# Patient Record
Sex: Male | Born: 1990 | Race: Black or African American | Hispanic: No | Marital: Single | Smoking: Never smoker
Health system: Southern US, Community
[De-identification: ages and names within clinical notes are randomized; demographics above are authoritative.]

---
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
## 2011-11-15 IMAGING — CR DG SHOULDER 2+V*R*
4 series · 4 of 4 positions shown · non-contrast
Comparison: None

CLINICAL DATA: Pain.  Trauma.

RIGHT SHOULDER - 2+ VIEW

[view not recorded (1 of 4)]
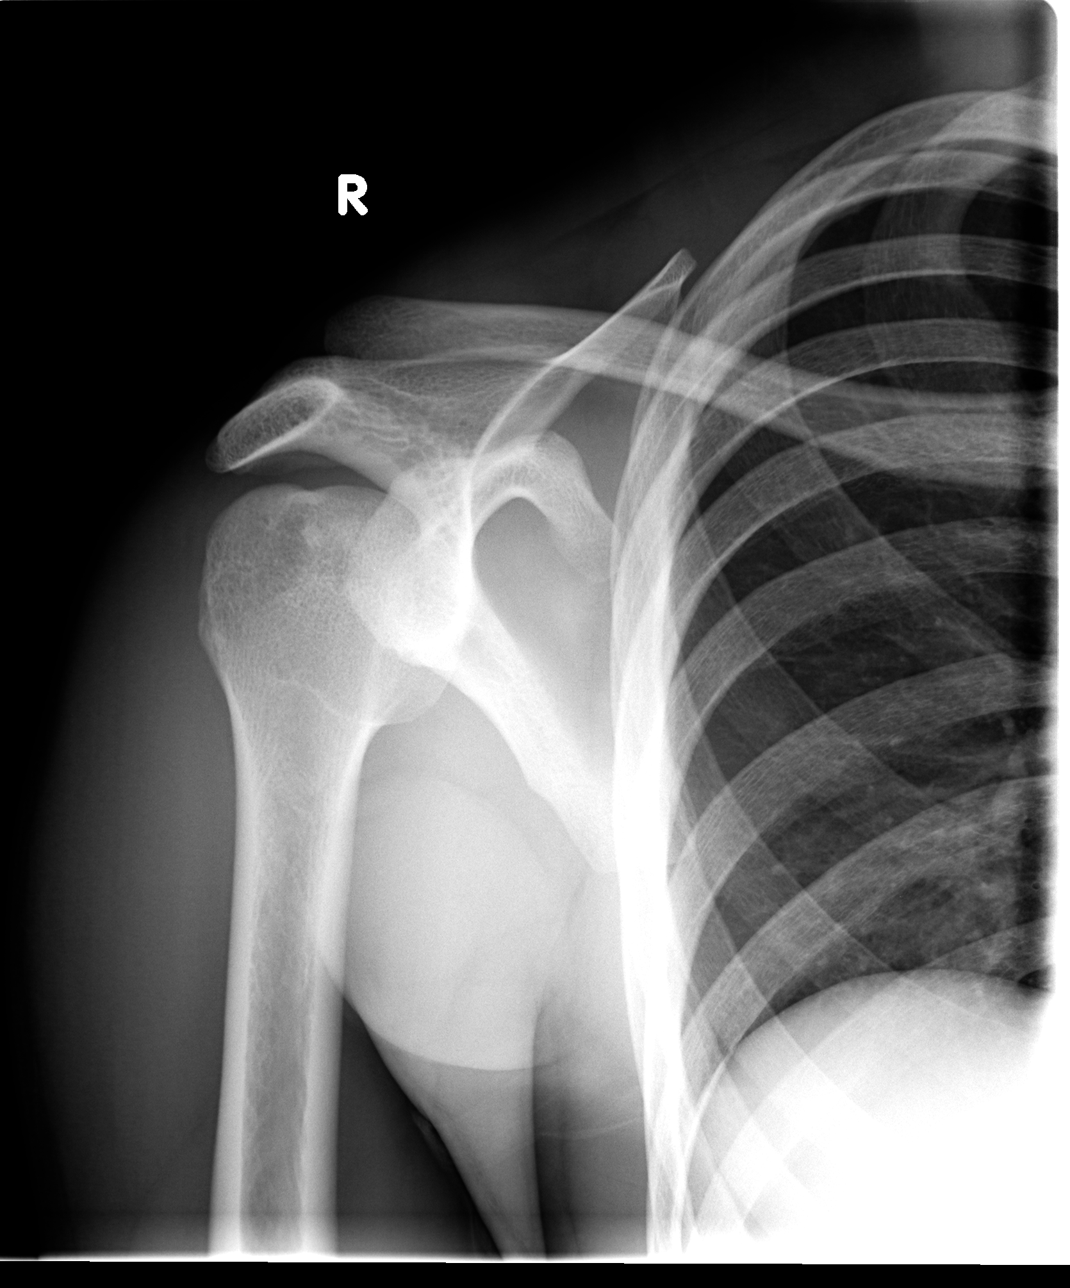

[view not recorded (2 of 4)]
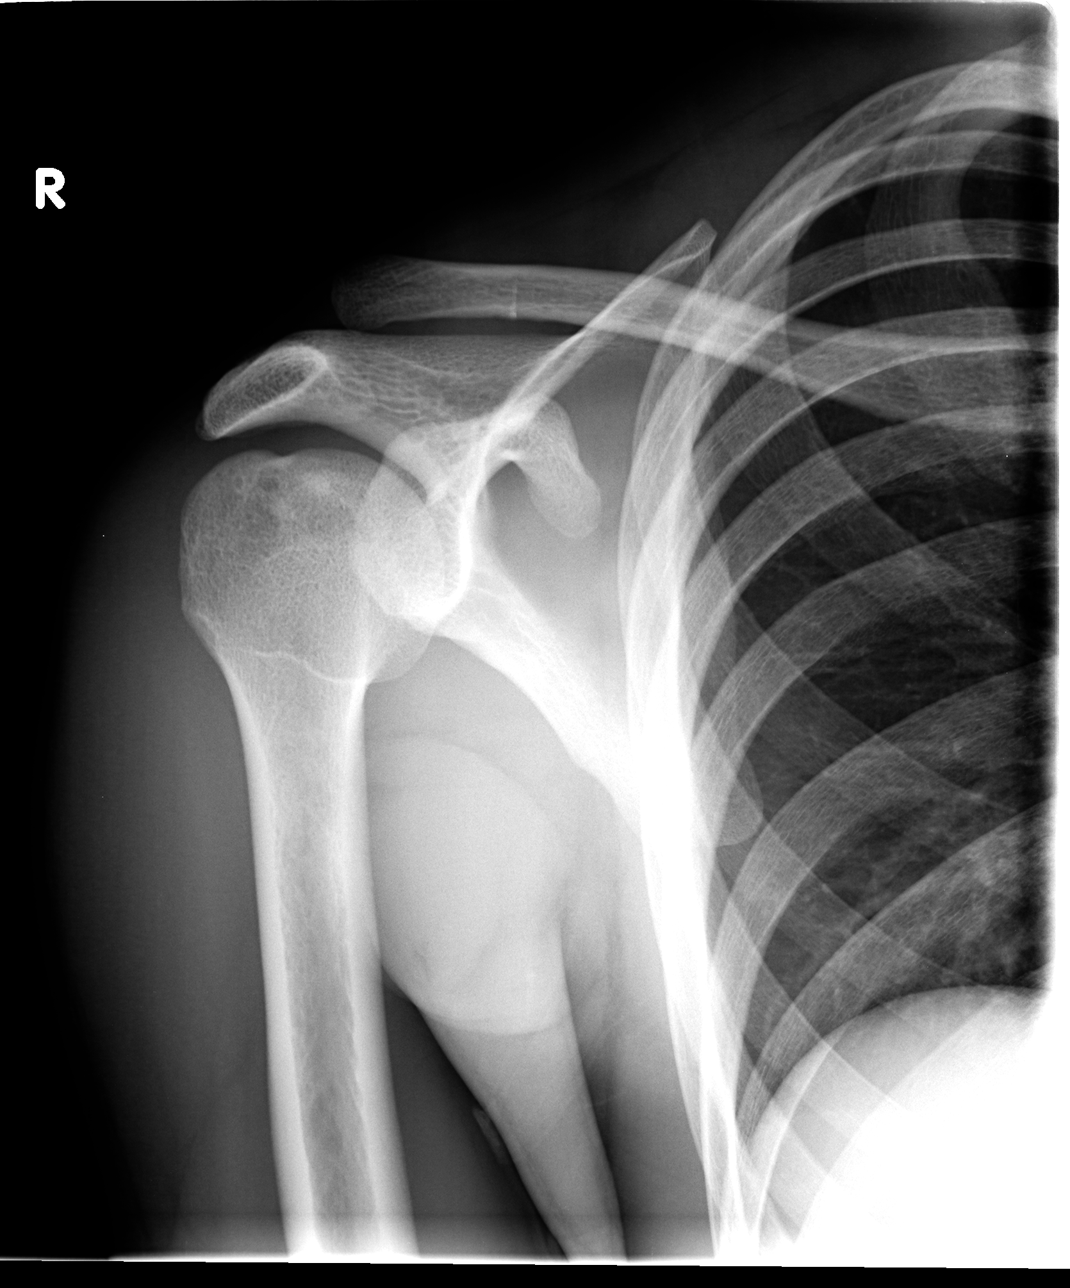

[view not recorded (3 of 4)]
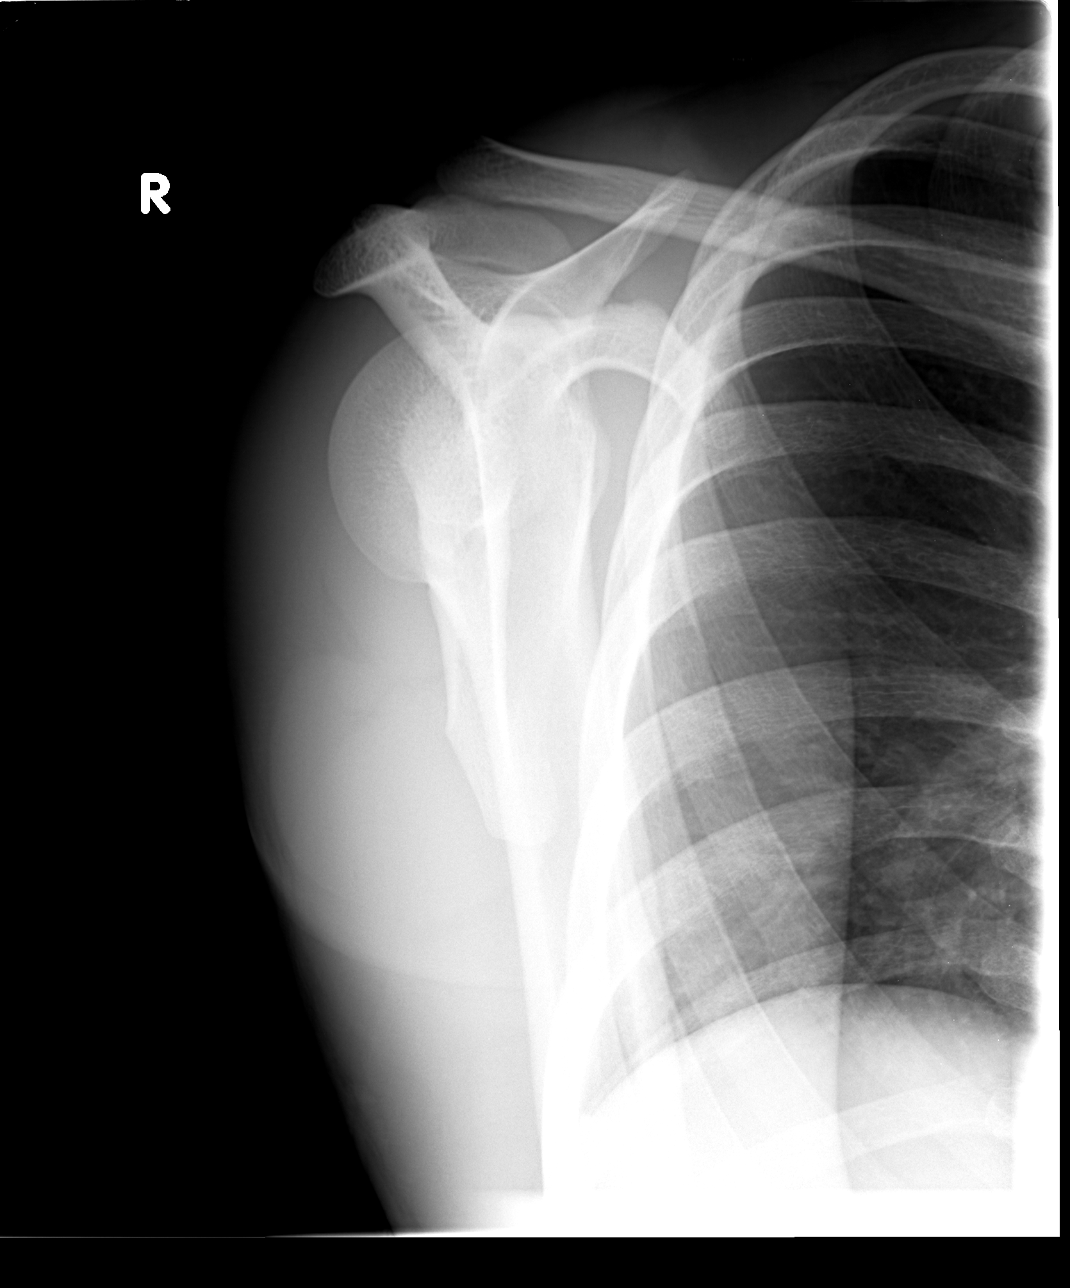

[view not recorded (4 of 4)]
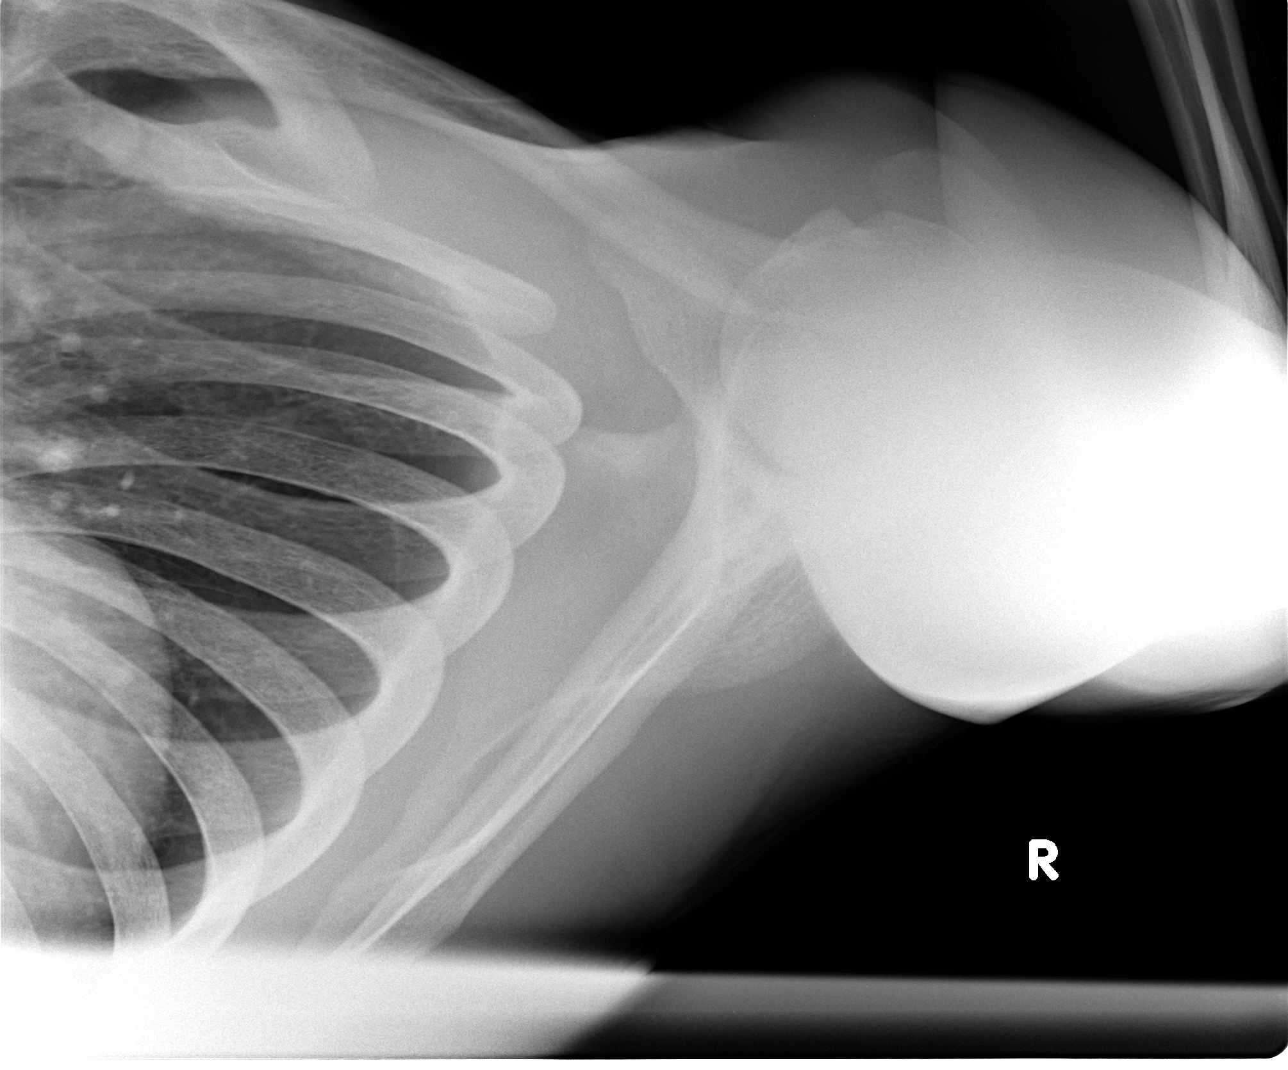

[4 of 4 positions shown; findings below may reference images not displayed]

FINDINGS: The glenohumeral joint is located.

No fracture identified.

Mild widening of the acromioclavicular interval which measures
mm.
IMPRESSION: 1.  Mild widening of the AC joint interval.  Cannot rule out to
grade II or grade III AC joint separation.

## 2012-04-15 ENCOUNTER — Encounter (HOSPITAL_COMMUNITY): Payer: Self-pay

## 2012-04-15 ENCOUNTER — Emergency Department (INDEPENDENT_AMBULATORY_CARE_PROVIDER_SITE_OTHER)
Admission: EM | Admit: 2012-04-15 | Discharge: 2012-04-15 | Disposition: A | Discharge status: Home / Self Care | Payer: Self-pay | Source: Home / Self Care

## 2012-04-15 DIAGNOSIS — S139XXA Sprain of joints and ligaments of unspecified parts of neck, initial encounter: Secondary | ICD-10-CM

## 2012-04-15 DIAGNOSIS — S43499A Other sprain of unspecified shoulder joint, initial encounter: Secondary | ICD-10-CM

## 2012-04-15 DIAGNOSIS — S46819A Strain of other muscles, fascia and tendons at shoulder and upper arm level, unspecified arm, initial encounter: Secondary | ICD-10-CM

## 2012-04-15 MED ORDER — TRAMADOL HCL 50 MG PO TABS: 50.0000 mg | ORAL_TABLET | Freq: Four times a day (QID) | ORAL | Status: AC | PRN

## 2012-04-15 MED ORDER — NAPROXEN 375 MG PO TABS: 375.0000 mg | ORAL_TABLET | Freq: Two times a day (BID) | ORAL | Status: AC

## 2012-04-15 MED ORDER — CYCLOBENZAPRINE HCL 5 MG PO TABS: 5.0000 mg | ORAL_TABLET | Freq: Three times a day (TID) | ORAL | Status: AC | PRN
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
## 2012-04-16 NOTE — ED Provider Notes (Signed)
Medical screening examination/treatment/procedure(s) were performed by resident physician or non-physician practitioner and as supervising physician I was immediately available for consultation/collaboration.   Barkley Bruns MD.   Linna Hoff, MD 04/16/12 2002

## 2014-08-04 ENCOUNTER — Encounter (HOSPITAL_COMMUNITY): Payer: Self-pay | Admitting: Emergency Medicine

## 2014-08-04 ENCOUNTER — Other Ambulatory Visit (HOSPITAL_COMMUNITY)
Admission: RE | Admit: 2014-08-04 | Discharge: 2014-08-04 | Disposition: A | Discharge status: Home / Self Care | Payer: Self-pay | Source: Ambulatory Visit | Attending: Emergency Medicine | Admitting: Emergency Medicine

## 2014-08-04 ENCOUNTER — Emergency Department (INDEPENDENT_AMBULATORY_CARE_PROVIDER_SITE_OTHER)
Admission: EM | Admit: 2014-08-04 | Discharge: 2014-08-04 | Disposition: A | Discharge status: Home / Self Care | Payer: Self-pay | Source: Home / Self Care | Attending: Emergency Medicine | Admitting: Emergency Medicine

## 2014-08-04 DIAGNOSIS — R300 Dysuria: Secondary | ICD-10-CM

## 2014-08-04 DIAGNOSIS — Z113 Encounter for screening for infections with a predominantly sexual mode of transmission: Secondary | ICD-10-CM | POA: Insufficient documentation

## 2014-08-04 LAB — POCT URINALYSIS DIP (DEVICE)
Glucose, UA: NEGATIVE mg/dL
Hgb urine dipstick: NEGATIVE
Ketones, ur: NEGATIVE mg/dL
Leukocytes, UA: NEGATIVE
Nitrite: NEGATIVE
PH: 6 (ref 5.0–8.0)
Protein, ur: 30 mg/dL — AB
Specific Gravity, Urine: >=1.03 (ref 1.005–1.030)
Urobilinogen, UA: 1 mg/dL (ref 0.0–1.0)

## 2014-08-04 MED ORDER — AZITHROMYCIN 250 MG PO TABS
ORAL_TABLET | ORAL | Status: AC
  Filled 2014-08-04: qty 4

## 2014-08-04 MED ORDER — LIDOCAINE HCL (PF) 1 % IJ SOLN
INTRAMUSCULAR | Status: AC
  Filled 2014-08-04: qty 5

## 2014-08-04 MED ORDER — CEFTRIAXONE SODIUM 250 MG IJ SOLR: 250.0000 mg | Freq: Once | INTRAMUSCULAR | Status: DC

## 2014-08-04 MED ORDER — CEFTRIAXONE SODIUM 250 MG IJ SOLR
INTRAMUSCULAR | Status: AC
  Filled 2014-08-04: qty 250

## 2014-08-04 MED ORDER — AZITHROMYCIN 250 MG PO TABS: 1000.0000 mg | ORAL_TABLET | Freq: Once | ORAL | Status: DC

## 2014-08-04 NOTE — ED Notes (Signed)
Patient provided only one urine specimen, no clean catch urine specimen

## 2014-08-04 NOTE — ED Provider Notes (Addendum)
CSN: [id number]     Arrival date & time 08/04/14  1259 History   First MD Initiated Contact with Patient 08/04/14 1325     Chief Complaint  Patient presents with  . Dysuria   (Consider location/radiation/quality/duration/timing/severity/associated sxs/prior Treatment) HPI  He is a 24 year old man here for evaluation of dysuria. He states this started about 2 weeks ago and occurs every time he urinates. He denies any urinary urgency, hesitancy, frequency, foul odor. No penile discharge or rashes. No abdominal pain or back pain. No fevers or chills. He denies any new partners, but states he is sexually active. He does not always use a condom. He also states that 6-8 weeks ago he started having difficulty getting and maintaining an erection. He reports decreased morning erections and decreased masturbation as well. He does report some increased stress in his life, but does not associate that with these symptoms.  History reviewed. No pertinent past medical history. History reviewed. No pertinent past surgical history. No family history on file. History  Substance Use Topics  . Smoking status: Never Smoker   . Smokeless tobacco: Not on file  . Alcohol Use: No    Review of Systems As in history of present illness Allergies  Review of patient's allergies indicates no known allergies.  Home Medications   Prior to Admission medications   Medication Sig Start Date End Date Taking? Authorizing Provider  cyclobenzaprine (FLEXERIL) 5 MG tablet Take 1 tablet (5 mg total) by mouth 3 (three) times daily as needed for muscle spasms. 04/15/12   Hayden Rasmussenavid Mabe, NP  naproxen (NAPROSYN) 375 MG tablet Take 1 tablet (375 mg total) by mouth 2 (two) times daily. 04/15/12   Hayden Rasmussenavid Mabe, NP  traMADol (ULTRAM) 50 MG tablet Take 1 tablet (50 mg total) by mouth every 6 (six) hours as needed for pain. 04/15/12   Hayden Rasmussenavid Mabe, NP   BP 136/71 mmHg  Pulse 71  Temp(Src) 97.8 F (36.6 C) (Oral)  Resp 16  SpO2 99% Physical  Exam  Constitutional: He is oriented to person, place, and time. He appears well-developed and well-nourished. No distress.  Cardiovascular: Normal rate.   Pulmonary/Chest: Effort normal.  Abdominal: Soft. He exhibits no distension. There is no tenderness. There is no rebound and no guarding.  No CVA tenderness  Neurological: He is alert and oriented to person, place, and time.    ED Course  Procedures (including critical care time) Labs Review Labs Reviewed  POCT URINALYSIS DIP (DEVICE) - Abnormal; Notable for the following:    Bilirubin Urine SMALL (*)    Protein, ur 30 (*)    All other components within normal limits  HIV ANTIBODY (ROUTINE TESTING)  RPR  URINE CYTOLOGY ANCILLARY ONLY    Imaging Review No results found.   MDM   1. Dysuria    Treatment presumptively for gonorrhea and Chlamydia with Rocephin 250 mg IM and azithromycin 1 g by mouth.  Gonorrhea, chlamydia, trichomonas sent. HIV and RPR sent as well. Discussed that his erection difficulties will likely resolve on their own given his age and lack of medical problems. If these persist, I recommended follow-up with urology.    Charm RingsErin J Kailan Carmen, MD 08/04/14 1358  Charm RingsErin J Aarica Wax, MD 08/04/14 1400

## 2014-08-04 NOTE — Discharge Instructions (Signed)
We treated you today for gonorrhea and chlamydia. We sent STD testing. We will call you if anything is positive. No sex for one week. Please wear a condom. If you continue to have difficulty with erections, please see the urologist.

## 2014-08-04 NOTE — ED Notes (Signed)
C/o mild dysuria onset 2 weeks Denies penile d/c, fevers, chills, hematuria, abd pain Alert, no signs of acute distress.

## 2014-08-05 LAB — URINE CYTOLOGY ANCILLARY ONLY
Chlamydia: POSITIVE — AB
Neisseria Gonorrhea: NEGATIVE
Trichomonas: NEGATIVE

## 2014-08-05 LAB — RPR: RPR Ser Ql: NONREACTIVE

## 2014-08-05 LAB — HIV ANTIBODY (ROUTINE TESTING W REFLEX): HIV SCREEN 4TH GENERATION: NONREACTIVE

## 2014-08-05 NOTE — ED Notes (Signed)
Final report of STD screening positive for chlamydia. treatment adequate on day of visit. Form 2124 completed, [facility]. Call to patient on number provided on day of visit. Left message for patient to contact us to discuss visit

## 2014-08-05 NOTE — ED Notes (Addendum)
Patient returned call, and after verifying ID, discussed positive findings. Chlamydia positive, GC negative  Was advised no sex x 1 week, and he is to inform any partners of their exposure, so they may also be treated. Patient verbalised understanding. HIV and RPR reports negative

## 2014-09-10 ENCOUNTER — Emergency Department (HOSPITAL_COMMUNITY)
Admission: EM | Admit: 2014-09-10 | Discharge: 2014-09-10 | Disposition: A | Discharge status: Home / Self Care | Payer: Self-pay

## 2014-09-12 ENCOUNTER — Encounter (HOSPITAL_COMMUNITY): Payer: Self-pay | Admitting: Emergency Medicine

## 2014-09-12 ENCOUNTER — Emergency Department (INDEPENDENT_AMBULATORY_CARE_PROVIDER_SITE_OTHER)
Admission: EM | Admit: 2014-09-12 | Discharge: 2014-09-12 | Disposition: A | Discharge status: Home / Self Care | Payer: Self-pay | Source: Home / Self Care | Attending: Family Medicine | Admitting: Family Medicine

## 2014-09-12 DIAGNOSIS — Z711 Person with feared health complaint in whom no diagnosis is made: Secondary | ICD-10-CM

## 2014-09-12 MED ORDER — DOXYCYCLINE HYCLATE 100 MG PO CAPS: 100.0000 mg | ORAL_CAPSULE | Freq: Two times a day (BID) | ORAL | Status: AC

## 2014-09-12 NOTE — ED Notes (Addendum)
Pt returns today with continued STD sx's post treatment of Chlamydia x 1 month ago,denies penile d/c C/o burning on urination, denies pain, chills or fever States he had unprotected sexual intercourse 1 week post treatment

## 2014-09-12 NOTE — ED Provider Notes (Signed)
CSN: [id number]     Arrival date & time 09/12/14  1304 History   First MD Initiated Contact with Patient 09/12/14 1313     Chief Complaint  Patient presents with  . SEXUALLY TRANSMITTED DISEASE   (Consider location/radiation/quality/duration/timing/severity/associated sxs/prior Treatment) Patient is a 24 y.o. male presenting with STD exposure. The history is provided by the patient.  Exposure to STD This is a recurrent problem. The current episode started more than 1 week ago (treated for chlamydia 1 mo ago, sx never resolved, continues with unprotected sex.). The problem has not changed since onset.Pertinent negatives include no chest pain and no abdominal pain.    History reviewed. No pertinent past medical history. History reviewed. No pertinent past surgical history. No family history on file. Social History  Substance Use Topics  . Smoking status: Never Smoker   . Smokeless tobacco: None  . Alcohol Use: No    Review of Systems  Constitutional: Negative.   Cardiovascular: Negative for chest pain.  Gastrointestinal: Negative for abdominal pain.  Genitourinary: Positive for dysuria. Negative for discharge, penile swelling, scrotal swelling, penile pain and testicular pain.    Allergies  Review of patient's allergies indicates no known allergies.  Home Medications   Prior to Admission medications   Medication Sig Start Date End Date Taking? Authorizing Provider  cyclobenzaprine (FLEXERIL) 5 MG tablet Take 1 tablet (5 mg total) by mouth 3 (three) times daily as needed for muscle spasms. 04/15/12   Hayden Rasmussen, NP  doxycycline (VIBRAMYCIN) 100 MG capsule Take 1 capsule (100 mg total) by mouth 2 (two) times daily. 09/12/14   Linna Hoff, MD  naproxen (NAPROSYN) 375 MG tablet Take 1 tablet (375 mg total) by mouth 2 (two) times daily. 04/15/12   Hayden Rasmussen, NP  traMADol (ULTRAM) 50 MG tablet Take 1 tablet (50 mg total) by mouth every 6 (six) hours as needed for pain. 04/15/12   Hayden Rasmussen, NP   BP 139/65 mmHg  Pulse 70  Temp(Src) 98.4 F (36.9 C) (Oral)  Resp 16  SpO2 98% Physical Exam  Constitutional: He is oriented to person, place, and time. He appears well-developed and well-nourished. No distress.  Abdominal: Soft. Bowel sounds are normal.  Genitourinary: Penis normal.  Neurological: He is alert and oriented to person, place, and time.  Skin: Skin is warm and dry.  Nursing note and vitals reviewed.   ED Course  Procedures (including critical care time) Labs Review Labs Reviewed - No data to display  Imaging Review No results found.   MDM   1. Concern about STD in male without diagnosis        Linna Hoff, MD 09/12/14 1327

## 2015-09-20 ENCOUNTER — Encounter (HOSPITAL_COMMUNITY): Payer: Self-pay | Admitting: *Deleted

## 2015-09-20 ENCOUNTER — Emergency Department (HOSPITAL_COMMUNITY)
Admission: EM | Admit: 2015-09-20 | Discharge: 2015-09-20 | Disposition: A | Discharge status: Home / Self Care | Payer: Self-pay | Attending: Emergency Medicine | Admitting: Emergency Medicine

## 2015-09-20 DIAGNOSIS — R300 Dysuria: Secondary | ICD-10-CM | POA: Insufficient documentation

## 2015-09-20 DIAGNOSIS — Z79899 Other long term (current) drug therapy: Secondary | ICD-10-CM | POA: Insufficient documentation

## 2015-09-20 DIAGNOSIS — R590 Localized enlarged lymph nodes: Secondary | ICD-10-CM | POA: Insufficient documentation

## 2015-09-20 LAB — URINALYSIS, ROUTINE W REFLEX MICROSCOPIC
Glucose, UA: NEGATIVE mg/dL
Hgb urine dipstick: NEGATIVE
KETONES UR: 15 mg/dL — AB
Leukocytes, UA: NEGATIVE
Nitrite: NEGATIVE
Protein, ur: NEGATIVE mg/dL
SPECIFIC GRAVITY, URINE: 1.038 — AB (ref 1.005–1.030)
pH: 5.5 (ref 5.0–8.0)

## 2015-09-20 MED ORDER — IBUPROFEN 800 MG PO TABS: 800.0000 mg | ORAL_TABLET | Freq: Three times a day (TID) | ORAL | 0 refills | Status: AC | PRN

## 2015-09-20 MED ORDER — ONDANSETRON 8 MG PO TBDP
8.0000 mg | ORAL_TABLET | Freq: Once | ORAL | Status: AC
  Administered 2015-09-20: 8 mg via ORAL
  Filled 2015-09-20: qty 1

## 2015-09-20 MED ORDER — METRONIDAZOLE 500 MG PO TABS
2000.0000 mg | ORAL_TABLET | Freq: Once | ORAL | Status: AC
  Administered 2015-09-20: 2000 mg via ORAL
  Filled 2015-09-20: qty 4

## 2015-09-20 MED ORDER — AZITHROMYCIN 1 G PO PACK
1.0000 g | PACK | Freq: Once | ORAL | Status: AC
  Administered 2015-09-20: 1 g via ORAL
  Filled 2015-09-20 (×2): qty 1

## 2015-09-20 MED ORDER — PHENAZOPYRIDINE HCL 200 MG PO TABS: 200.0000 mg | ORAL_TABLET | Freq: Three times a day (TID) | ORAL | 0 refills | Status: AC | PRN

## 2015-09-20 MED ORDER — LIDOCAINE HCL 1 % IJ SOLN
INTRAMUSCULAR | Status: AC
  Administered 2015-09-20: 20 mL
  Filled 2015-09-20: qty 20

## 2015-09-20 MED ORDER — CEFTRIAXONE SODIUM 250 MG IJ SOLR
250.0000 mg | Freq: Once | INTRAMUSCULAR | Status: AC
  Administered 2015-09-20: 250 mg via INTRAMUSCULAR
  Filled 2015-09-20: qty 250

## 2015-09-20 NOTE — Discharge Instructions (Signed)
Read the information below.  Use the prescribed medication as directed.  Please discuss all new medications with your pharmacist.  You may return to the Emergency Department at any time for worsening condition or any new symptoms that concern you.  If you develop high fevers, abdominal pain, uncontrolled vomiting, or are unable to tolerate fluids by mouth, return to the ER for a recheck.   °

## 2015-09-20 NOTE — ED Provider Notes (Signed)
[facility]-EMERGENCY DEPT Provider Note   CSN: [id number] Arrival date & time: 09/20/15  1646  By signing my name below, I, Dan Cooper, attest that this documentation has been prepared under the direction and in the presence of [facility] Campus[facility]ily Aiza Vollrath, PA-C. Electronically Signed: Javier Dockerobert Ryan Cooper, ER Scribe. 09/10/2015. 8:05 PM.   History   Chief Complaint Chief Complaint  Patient presents with  . Pelvic Pain    HPI  HPI Comments: Dan MattocksMalcolm Cooper is a 25 y.o. male who presents to the Emergency Department complaining of discomfort with urination, penile pain, testicular pain, and swollen lumps in his groin for the last two weeks. His sx started with dysuria two weeks ago. That sx resolved after he drank apple cider vinegar.  The pain in his penis and testicles is sharp, intermittent, lasts less than a second at a time.  He has had the same sexual partner for a year and they use protection some of the time.  Pt also notes he has had some problems with getting erections for some time, but worse now that he has this painful lymph node.  He denies fever, chills, nausea, or vomiting.  Denies any testicular or scrotal swelling.   Denies any skin lesions in his groin or legs.  Never had any back, flank, or abdominal pain.    History reviewed. No pertinent past medical history.  There are no active problems to display for this patient.   History reviewed. No pertinent surgical history.   Home Medications    Prior to Admission medications   Medication Sig Start Date End Date Taking? Authorizing Provider  cyclobenzaprine (FLEXERIL) 5 MG tablet Take 1 tablet (5 mg total) by mouth 3 (three) times daily as needed for muscle spasms. 04/15/12   Hayden Rasmussenavid Mabe, NP  doxycycline (VIBRAMYCIN) 100 MG capsule Take 1 capsule (100 mg total) by mouth 2 (two) times daily. 09/12/14   Linna HoffJames D Kindl, MD  ibuprofen (ADVIL,MOTRIN) 800 MG tablet Take 1 tablet (800 mg total) by mouth every 8 (eight) hours as needed for mild pain  or moderate pain. 09/20/15   Trixie DredgeEmily Mehki Klumpp, PA-C  naproxen (NAPROSYN) 375 MG tablet Take 1 tablet (375 mg total) by mouth 2 (two) times daily. 04/15/12   Hayden Rasmussenavid Mabe, NP  phenazopyridine (PYRIDIUM) 200 MG tablet Take 1 tablet (200 mg total) by mouth 3 (three) times daily as needed for pain (burning with urination). 09/20/15   Trixie DredgeEmily Navy Belay, PA-C  traMADol (ULTRAM) 50 MG tablet Take 1 tablet (50 mg total) by mouth every 6 (six) hours as needed for pain. 04/15/12   Hayden Rasmussenavid Mabe, NP    Family History No family history on file.  Social History Social History  Substance Use Topics  . Smoking status: Never Smoker  . Smokeless tobacco: Never Used  . Alcohol use No     Allergies   Review of patient's allergies indicates no known allergies.   Review of Systems Review of Systems  All other systems reviewed and are negative.    Physical Exam Updated Vital Signs BP 108/88 (BP Location: Left Arm)   Pulse 66   Temp 98.4 F (36.9 C) (Oral)   Resp 18   SpO2 100%   Physical Exam  Constitutional: He appears well-developed and well-nourished. No distress.  HENT:  Head: Normocephalic and atraumatic.  Neck: Neck supple.  Pulmonary/Chest: Effort normal.  Abdominal: Soft. He exhibits no distension and no mass. There is no tenderness. There is no rebound and no guarding.  Genitourinary: Testes normal  and penis normal. Right testis shows no mass, no swelling and no tenderness. Right testis is descended. Left testis shows no mass, no swelling and no tenderness. Left testis is descended. Circumcised. No penile erythema or penile tenderness. No discharge found.  Musculoskeletal:  Spine nontender, no crepitus, or stepoffs.   Lymphadenopathy: Inguinal adenopathy noted on the right side. No inguinal adenopathy noted on the left side.  Neurological: He is alert.  Skin: He is not diaphoretic.  Nursing note and vitals reviewed.    ED Treatments / Results  DIAGNOSTIC STUDIES: Oxygen Saturation is 100% on RA,  normal by my interpretation.    COORDINATION OF CARE: 8:05 PM Discussed treatment plan with pt at bedside which includes STI screening and treatment if required and pt agreed to plan.  Labs (all labs ordered are listed, but only abnormal results are displayed) Labs Reviewed  URINALYSIS, ROUTINE W REFLEX MICROSCOPIC (NOT AT [facility]) - Abnormal; Notable for the following:       Result Value   Specific Gravity, Urine 1.038 (*)    Bilirubin Urine SMALL (*)    Ketones, ur 15 (*)    All other components within normal limits  URINE CULTURE  RPR  HIV ANTIBODY (ROUTINE TESTING)  GC/CHLAMYDIA PROBE AMP ([place]) NOT AT [facility]    EKG  EKG Interpretation None       Radiology No results found.  Procedures Procedures (including critical care time)  Medications Ordered in ED Medications  cefTRIAXone (ROCEPHIN) injection 250 mg (not administered)  azithromycin (ZITHROMAX) powder 1 g (not administered)  ondansetron (ZOFRAN-ODT) disintegrating tablet 8 mg (not administered)  metroNIDAZOLE (FLAGYL) tablet 2,000 mg (not administered)  lidocaine (XYLOCAINE) 1 % (with pres) injection (not administered)     Initial Impression / Assessment and Plan / ED Course  I have reviewed the triage vital signs and the nursing notes.  Pertinent labs & imaging results that were available during my care of the patient were reviewed by me and considered in my medical decision making (see chart for details).  Clinical Course   Afebrile, nontoxic patient with right inguinal lymphadenopathy (single lymph node) with resolved dysuria and intermittent sharp shooting pains in the penis and testicles.  He never had flank, back, or abdominal pain.  Doubt ureteral stone.  Pt doubts STD but full testing and treatment given (by his request).  UA does not appear infected - does appear somewhat dehydrated but pt very thirsty, worked all day in [employer].  Urine sent for culture.  Given symptoms and patient's concerns,  also patient's concerns with erectile dysfunction, will refer to urology.   D/C home with urology follow up.  Pt advised HIV/STD tests, urine culture pending.    Discussed result, findings, treatment, and follow up  with patient.  Pt given return precautions.  Pt verbalizes understanding and agrees with plan.       Final Clinical Impressions(s) / ED Diagnoses   Final diagnoses:  Inguinal lymphadenopathy  Dysuria    New Prescriptions New Prescriptions   IBUPROFEN (ADVIL,MOTRIN) 800 MG TABLET    Take 1 tablet (800 mg total) by mouth every 8 (eight) hours as needed for mild pain or moderate pain.   PHENAZOPYRIDINE (PYRIDIUM) 200 MG TABLET    Take 1 tablet (200 mg total) by mouth 3 (three) times daily as needed for pain (burning with urination).     I personally performed the services described in this documentation, which was scribed in my presence. The recorded information has been reviewed  and is accurate.     Trixie Dredge, PA-C 09/20/15 2132    Lorre Nick, MD 09/21/15 [phone]

## 2015-09-20 NOTE — ED Triage Notes (Signed)
Pt complains of painful lump to his pelvic area for the past week. Pt states he also had burning urination, which he states has improved after drinking more fluids.

## 2015-09-21 LAB — RPR: RPR Ser Ql: NONREACTIVE

## 2015-09-21 LAB — GC/CHLAMYDIA PROBE AMP (~~LOC~~) NOT AT ARMC
Chlamydia: NEGATIVE
Neisseria Gonorrhea: NEGATIVE

## 2015-09-21 LAB — HIV ANTIBODY (ROUTINE TESTING W REFLEX): HIV Screen 4th Generation wRfx: NONREACTIVE

## 2015-09-26 ENCOUNTER — Telehealth (HOSPITAL_BASED_OUTPATIENT_CLINIC_OR_DEPARTMENT_OTHER): Payer: Self-pay | Admitting: Emergency Medicine

## 2018-01-13 ENCOUNTER — Emergency Department (HOSPITAL_COMMUNITY)
Admission: EM | Admit: 2018-01-13 | Discharge: 2018-01-13 | Disposition: A | Discharge status: Home / Self Care | Payer: Self-pay | Attending: Emergency Medicine | Admitting: Emergency Medicine

## 2018-01-13 ENCOUNTER — Encounter (HOSPITAL_COMMUNITY): Payer: Self-pay

## 2018-01-13 ENCOUNTER — Emergency Department (HOSPITAL_COMMUNITY): Payer: Self-pay

## 2018-01-13 DIAGNOSIS — J069 Acute upper respiratory infection, unspecified: Secondary | ICD-10-CM | POA: Insufficient documentation

## 2018-01-13 DIAGNOSIS — Z7982 Long term (current) use of aspirin: Secondary | ICD-10-CM | POA: Insufficient documentation

## 2018-01-13 LAB — COMPREHENSIVE METABOLIC PANEL
ALK PHOS: 43 U/L (ref 38–126)
ALT: 30 U/L (ref 0–44)
AST: 28 U/L (ref 15–41)
Albumin: 3.9 g/dL (ref 3.5–5.0)
Anion gap: 7 (ref 5–15)
BUN: 6 mg/dL (ref 6–20)
CO2: 24 mmol/L (ref 22–32)
Calcium: 8.9 mg/dL (ref 8.9–10.3)
Chloride: 111 mmol/L (ref 98–111)
Creatinine, Ser: 0.98 mg/dL (ref 0.61–1.24)
GFR calc Af Amer: >60 mL/min (ref >60)
GFR calc non Af Amer: >60 mL/min (ref >60)
Glucose, Bld: 107 mg/dL — ABNORMAL HIGH (ref 70–99)
Potassium: 4 mmol/L (ref 3.5–5.1)
SODIUM: 142 mmol/L (ref 135–145)
Total Bilirubin: 0.7 mg/dL (ref 0.3–1.2)
Total Protein: 6.3 g/dL — ABNORMAL LOW (ref 6.5–8.1)

## 2018-01-13 LAB — CBC WITH DIFFERENTIAL/PLATELET
Abs Immature Granulocytes: 0.03 10*3/uL (ref 0.00–0.07)
Basophils Absolute: 0 10*3/uL (ref 0.0–0.1)
Basophils Relative: 0 %
EOS ABS: 0.1 10*3/uL (ref 0.0–0.5)
Eosinophils Relative: 1 %
HCT: 44.6 % (ref 39.0–52.0)
Hemoglobin: 14.9 g/dL (ref 13.0–17.0)
IMMATURE GRANULOCYTES: 0 %
Lymphocytes Relative: 8 %
Lymphs Abs: 1 10*3/uL (ref 0.7–4.0)
MCH: 29.4 pg (ref 26.0–34.0)
MCHC: 33.4 g/dL (ref 30.0–36.0)
MCV: 88 fL (ref 80.0–100.0)
Monocytes Absolute: 0.7 10*3/uL (ref 0.1–1.0)
Monocytes Relative: 6 %
Neutro Abs: 10.5 10*3/uL — ABNORMAL HIGH (ref 1.7–7.7)
Neutrophils Relative %: 85 %
Platelets: 159 10*3/uL (ref 150–400)
RBC: 5.07 MIL/uL (ref 4.22–5.81)
RDW: 12.6 % (ref 11.5–15.5)
WBC: 12.4 10*3/uL — ABNORMAL HIGH (ref 4.0–10.5)
nRBC: 0 % (ref 0.0–0.2)

## 2018-01-13 LAB — I-STAT CG4 LACTIC ACID, ED
Lactic Acid, Venous: 1.77 mmol/L (ref 0.5–1.9)
Lactic Acid, Venous: 0.94 mmol/L (ref 0.5–1.9)

## 2018-01-13 LAB — INFLUENZA PANEL BY PCR (TYPE A & B)
Influenza A By PCR: NEGATIVE
Influenza B By PCR: NEGATIVE

## 2018-01-13 MED ORDER — ALBUTEROL SULFATE (2.5 MG/3ML) 0.083% IN NEBU
5.0000 mg | INHALATION_SOLUTION | Freq: Once | RESPIRATORY_TRACT | Status: AC
  Administered 2018-01-13: 5 mg via RESPIRATORY_TRACT
  Filled 2018-01-13: qty 6

## 2018-01-13 MED ORDER — SODIUM CHLORIDE 0.9 % IV BOLUS
1000.0000 mL | Freq: Once | INTRAVENOUS | Status: AC
  Administered 2018-01-13: 1000 mL via INTRAVENOUS

## 2018-01-13 MED ORDER — ONDANSETRON HCL 4 MG/2ML IJ SOLN
4.0000 mg | Freq: Once | INTRAMUSCULAR | Status: AC
  Administered 2018-01-13: 4 mg via INTRAVENOUS
  Filled 2018-01-13: qty 2

## 2018-01-13 MED ORDER — ALBUTEROL SULFATE HFA 108 (90 BASE) MCG/ACT IN AERS
2.0000 | INHALATION_SPRAY | RESPIRATORY_TRACT | Status: DC | PRN
  Administered 2018-01-13: 2 via RESPIRATORY_TRACT
  Filled 2018-01-13: qty 6.7

## 2018-01-13 MED ORDER — KETOROLAC TROMETHAMINE 30 MG/ML IJ SOLN
30.0000 mg | Freq: Once | INTRAMUSCULAR | Status: AC
  Administered 2018-01-13: 30 mg via INTRAVENOUS
  Filled 2018-01-13: qty 1

## 2018-01-13 MED ORDER — GUAIFENESIN-DM 100-10 MG/5ML PO SYRP: 5.0000 mL | ORAL_SOLUTION | Freq: Three times a day (TID) | ORAL | 0 refills | Status: AC | PRN

## 2018-01-13 NOTE — ED Notes (Signed)
Patient transported to X-ray 

## 2018-01-13 NOTE — ED Notes (Signed)
Patient ambulated down hall on room air at 95%.

## 2018-01-13 NOTE — ED Provider Notes (Signed)
[facility]-EMERGENCY DEPT Provider Note   CSN: [id number] Arrival date & time: 01/13/18  1345     History   Chief Complaint Chief Complaint  Patient presents with  . Shortness of Breath    HPI Dan Cooper is a 27 y.o. male.  HPI Patient with shortness of breath and cough.  Woke up this morning.  Fevers.  Does not have clear sick contacts.  Reportedly had rhonchi for EMS but no rhonchi for me.  Had breathing treatment and states that he feels better.  Does not have lung problems.  No history of asthma.  Has had cough with some mild sputum production.  No nausea or vomiting.  No sore throat.  States his chest hurts when he coughs and breathes. History reviewed. No pertinent past medical history.  There are no active problems to display for this patient.   History reviewed. No pertinent surgical history.      Home Medications    Prior to Admission medications   Medication Sig Start Date End Date Taking? Authorizing Provider  aspirin 81 MG chewable tablet Chew 162 mg by mouth daily as needed for mild pain.   Yes [provider]  cyclobenzaprine (FLEXERIL) 5 MG tablet Take 1 tablet (5 mg total) by mouth 3 (three) times daily as needed for muscle spasms. Patient not taking: Reported on 01/13/2018 04/15/12   Hayden RasmussenMabe, David, NP  doxycycline (VIBRAMYCIN) 100 MG capsule Take 1 capsule (100 mg total) by mouth 2 (two) times daily. Patient not taking: Reported on 01/13/2018 09/12/14   Linna HoffKindl, James D, MD  guaiFENesin-dextromethorphan (ROBITUSSIN DM) 100-10 MG/5ML syrup Take 5 mLs by mouth 3 (three) times daily as needed for cough. 01/13/18   Benjiman CorePickering, Daniil Labarge, MD  ibuprofen (ADVIL,MOTRIN) 800 MG tablet Take 1 tablet (800 mg total) by mouth every 8 (eight) hours as needed for mild pain or moderate pain. Patient not taking: Reported on 01/13/2018 09/20/15   Trixie DredgeWest, Emily, PA-C  naproxen (NAPROSYN) 375 MG tablet Take 1 tablet (375 mg total) by mouth 2 (two) times  daily. Patient not taking: Reported on 01/13/2018 04/15/12   Hayden RasmussenMabe, David, NP  phenazopyridine (PYRIDIUM) 200 MG tablet Take 1 tablet (200 mg total) by mouth 3 (three) times daily as needed for pain (burning with urination). Patient not taking: Reported on 01/13/2018 09/20/15   Trixie DredgeWest, Emily, PA-C  traMADol (ULTRAM) 50 MG tablet Take 1 tablet (50 mg total) by mouth every 6 (six) hours as needed for pain. Patient not taking: Reported on 01/13/2018 04/15/12   Hayden RasmussenMabe, David, NP    Family History History reviewed. No pertinent family history.  Social History Social History   Tobacco Use  . Smoking status: Never Smoker  . Smokeless tobacco: Never Used  Substance Use Topics  . Alcohol use: No  . Drug use: No     Allergies   Patient has no known allergies.   Review of Systems Review of Systems  Constitutional: Positive for appetite change, fatigue and fever.  HENT: Negative for congestion.   Respiratory: Positive for cough and shortness of breath.   Gastrointestinal: Negative for abdominal pain.  Genitourinary: Negative for flank pain.  Musculoskeletal: Negative for back pain.  Skin: Negative for rash.  Neurological: Negative for weakness.  Hematological: Negative for adenopathy.  Psychiatric/Behavioral: Negative for confusion.     Physical Exam Updated Vital Signs BP 136/82   Pulse 99   Temp (!) 100.8 F (38.2 C) (Rectal)   Resp 19   SpO2 93%  Physical Exam HENT:     Head: Atraumatic.  Eyes:     Pupils: Pupils are equal, round, and reactive to light.  Neck:     Musculoskeletal: Neck supple.  Cardiovascular:     Rate and Rhythm: Normal rate.     Comments: Mild tachycardia Pulmonary:     Breath sounds: No decreased breath sounds, wheezing or rales.  Chest:     Chest wall: No mass.  Abdominal:     Palpations: Abdomen is soft.     Tenderness: There is no abdominal tenderness.  Musculoskeletal:     Right lower leg: No edema.     Left lower leg: No edema.  Skin:     General: Skin is warm.     Capillary Refill: Capillary refill takes less than 2 seconds.  Neurological:     General: No focal deficit present.     Mental Status: He is alert.      ED Treatments / Results  Labs (all labs ordered are listed, but only abnormal results are displayed) Labs Reviewed  COMPREHENSIVE METABOLIC PANEL - Abnormal; Notable for the following components:      Result Value   Glucose, Bld 107 (*)    Total Protein 6.3 (*)    All other components within normal limits  CBC WITH DIFFERENTIAL/PLATELET - Abnormal; Notable for the following components:   WBC 12.4 (*)    Neutro Abs 10.5 (*)    All other components within normal limits  INFLUENZA PANEL BY PCR (TYPE A & B)  URINALYSIS, ROUTINE W REFLEX MICROSCOPIC  I-STAT CG4 LACTIC ACID, ED  I-STAT CG4 LACTIC ACID, ED    EKG EKG Interpretation  Date/Time:  Monday January 13 2018 14:16:42 EST Ventricular Rate:  88 PR Interval:    QRS Duration: 91 QT Interval:  339 QTC Calculation: 411 R Axis:   61 Text Interpretation:  Sinus rhythm Nonspecific T abnrm, anterolateral leads ST elevation, consider lateral injury Confirmed by Benjiman Core [phone]) on 01/13/2018 3:42:41 PM   Radiology Dg Chest 2 View  Result Date: 01/13/2018 CLINICAL DATA:  Dyspnea, fever and malaise this morning EXAM: CHEST - 2 VIEW COMPARISON:  12/01/2003 FINDINGS: Pectus excavatum configuration of the anterior chest wall. Normal heart size and mediastinal contours. Clear lungs. Slightly bulbous deformity of the distal right clavicle since prior exam that may reflect old remote trauma and healing. IMPRESSION: No active cardiopulmonary disease. Pectus excavatum. Electronically Signed   By: Tollie Eth M.D.   On: 01/13/2018 14:15    Procedures Procedures (including critical care time)  Medications Ordered in ED Medications  albuterol (PROVENTIL HFA;VENTOLIN HFA) 108 (90 Base) MCG/ACT inhaler 2 puff (2 puffs Inhalation Given 01/13/18 1846)    albuterol (PROVENTIL) (2.5 MG/3ML) 0.083% nebulizer solution 5 mg (5 mg Nebulization Given 01/13/18 1412)  ketorolac (TORADOL) 30 MG/ML injection 30 mg (30 mg Intravenous Given 01/13/18 1525)  sodium chloride 0.9 % bolus 1,000 mL (0 mLs Intravenous Stopped 01/13/18 1644)  ondansetron (ZOFRAN) injection 4 mg (4 mg Intravenous Given 01/13/18 1844)     Initial Impression / Assessment and Plan / ED Course  I have reviewed the triage vital signs and the nursing notes.  Pertinent labs & imaging results that were available during my care of the patient were reviewed by me and considered in my medical decision making (see chart for details).     Patient with URI symptoms.  Has some sputum production.  Has fever.  Initial mild hypoxia for EMS with sats of  90%.  Cleared up however with breathing treatment.  However before discharge, actually after the first discharge been put in he had another episode of bronchospasm.  Cleared up with inhaler and patient felt much better.  Had did have some posttussive emesis.  Feels better after monitoring.  Discharge home with albuterol.  Without underlying asthma I do not think he needs steroids at this time.  May need outpatient follow-up.  Negative flu test.  X-ray does not show pneumonia.  Not hypoxic with ambulation.  Discharge home.  Final Clinical Impressions(s) / ED Diagnoses   Final diagnoses:  Upper respiratory tract infection, unspecified type    ED Discharge Orders         Ordered    guaiFENesin-dextromethorphan (ROBITUSSIN DM) 100-10 MG/5ML syrup  3 times daily PRN     01/13/18 1744           Benjiman Core, MD 01/13/18 1946

## 2018-01-13 NOTE — ED Triage Notes (Addendum)
Patient arrived via [facility]. Woke up this morning patient had shob, fever, and not feeling well.   102.0 fever with ems 1000mg  tylenol given per ems [place] in all fields.   Patient admits to vaping.   A/Ox4.   90% RA initially with EMS   20 LAC  147/93 96-p 18-RR 96%-4L [place]

## 2018-01-13 NOTE — ED Notes (Signed)
Bed: [id number] Expected date:  Expected time:  Means of arrival:  Comments: held

## 2019-07-31 IMAGING — CR DG CHEST 2V
2 series · 2 of 2 positions shown · non-contrast
Comparison: 12/01/2003

CLINICAL DATA: Dyspnea, fever and malaise this morning

EXAM:
CHEST - 2 VIEW

[w chest pa]
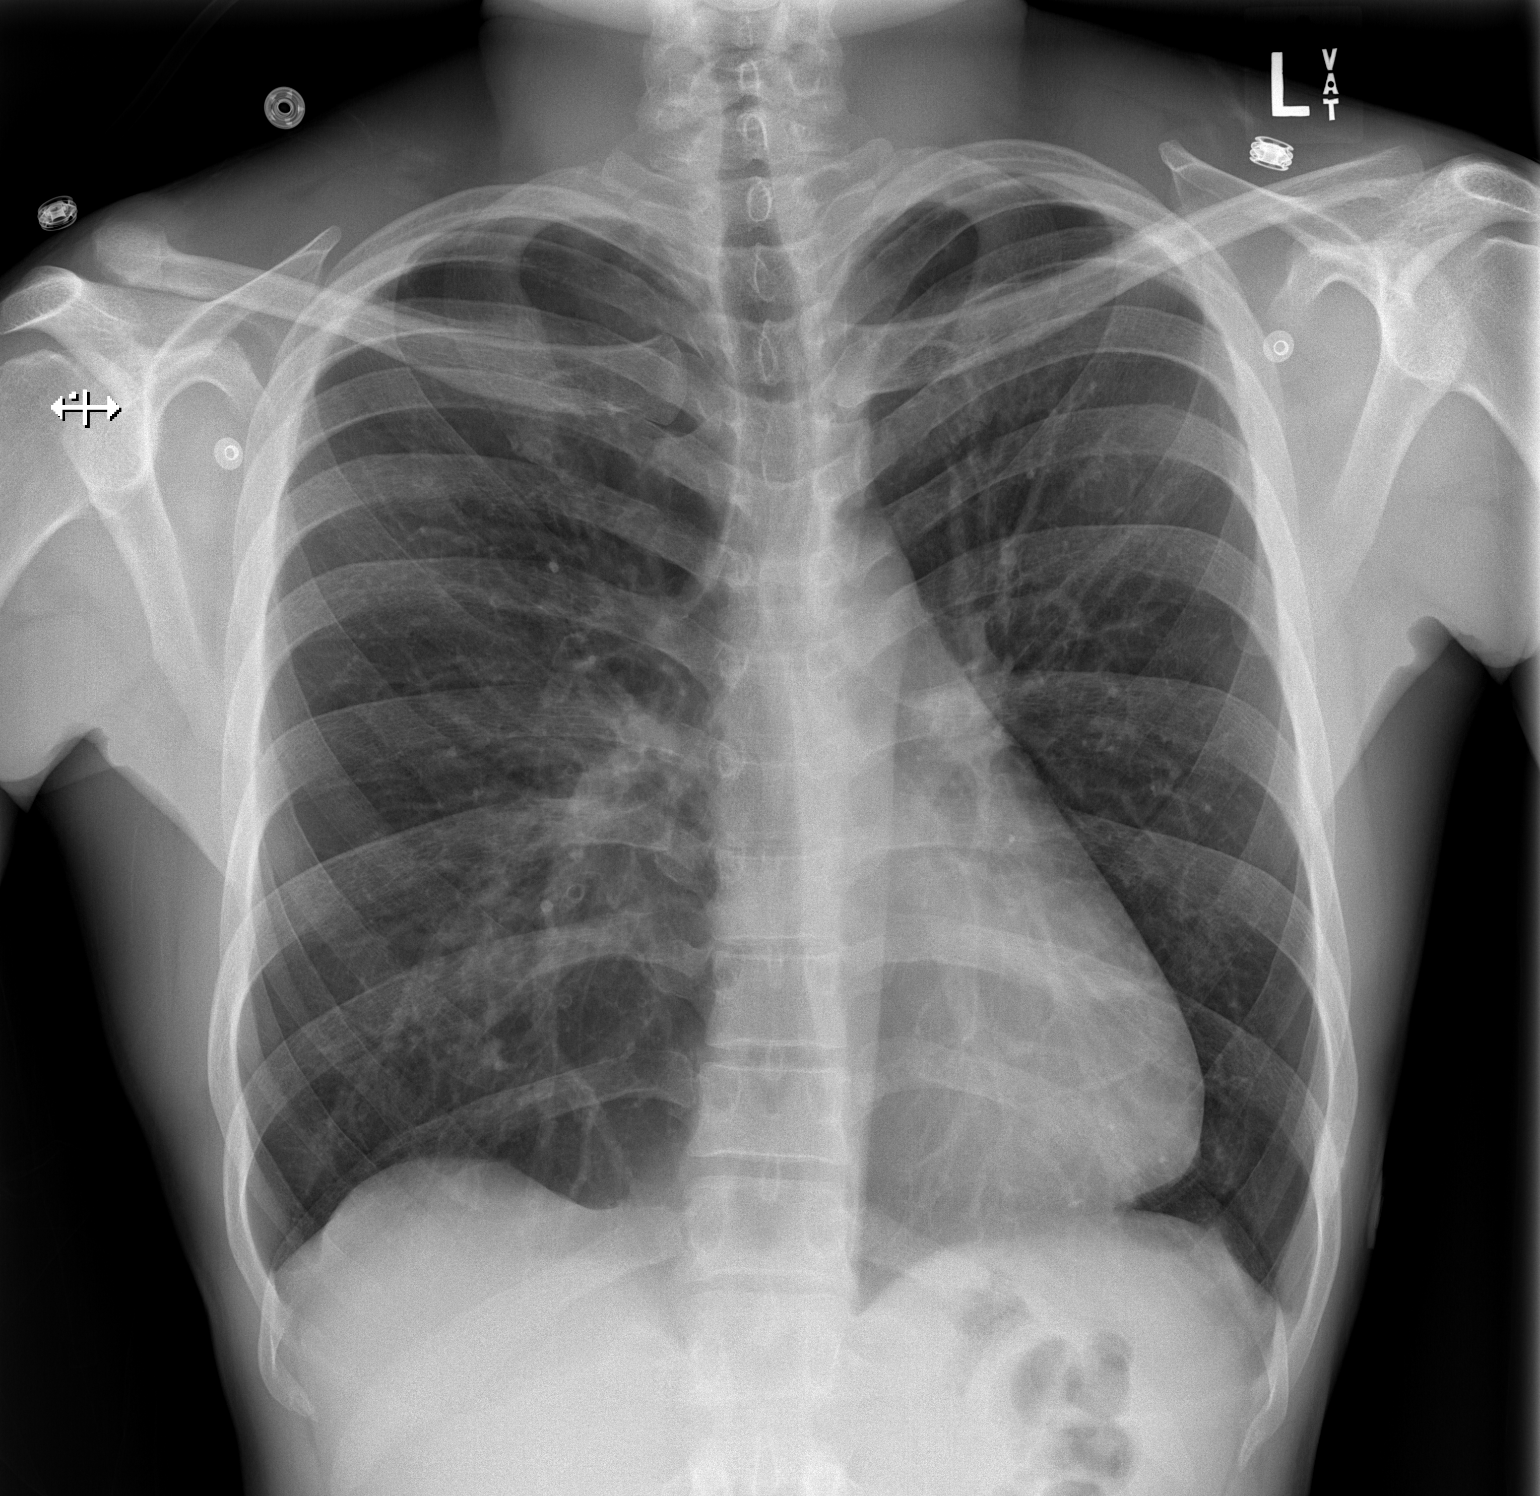

[w chest lat]
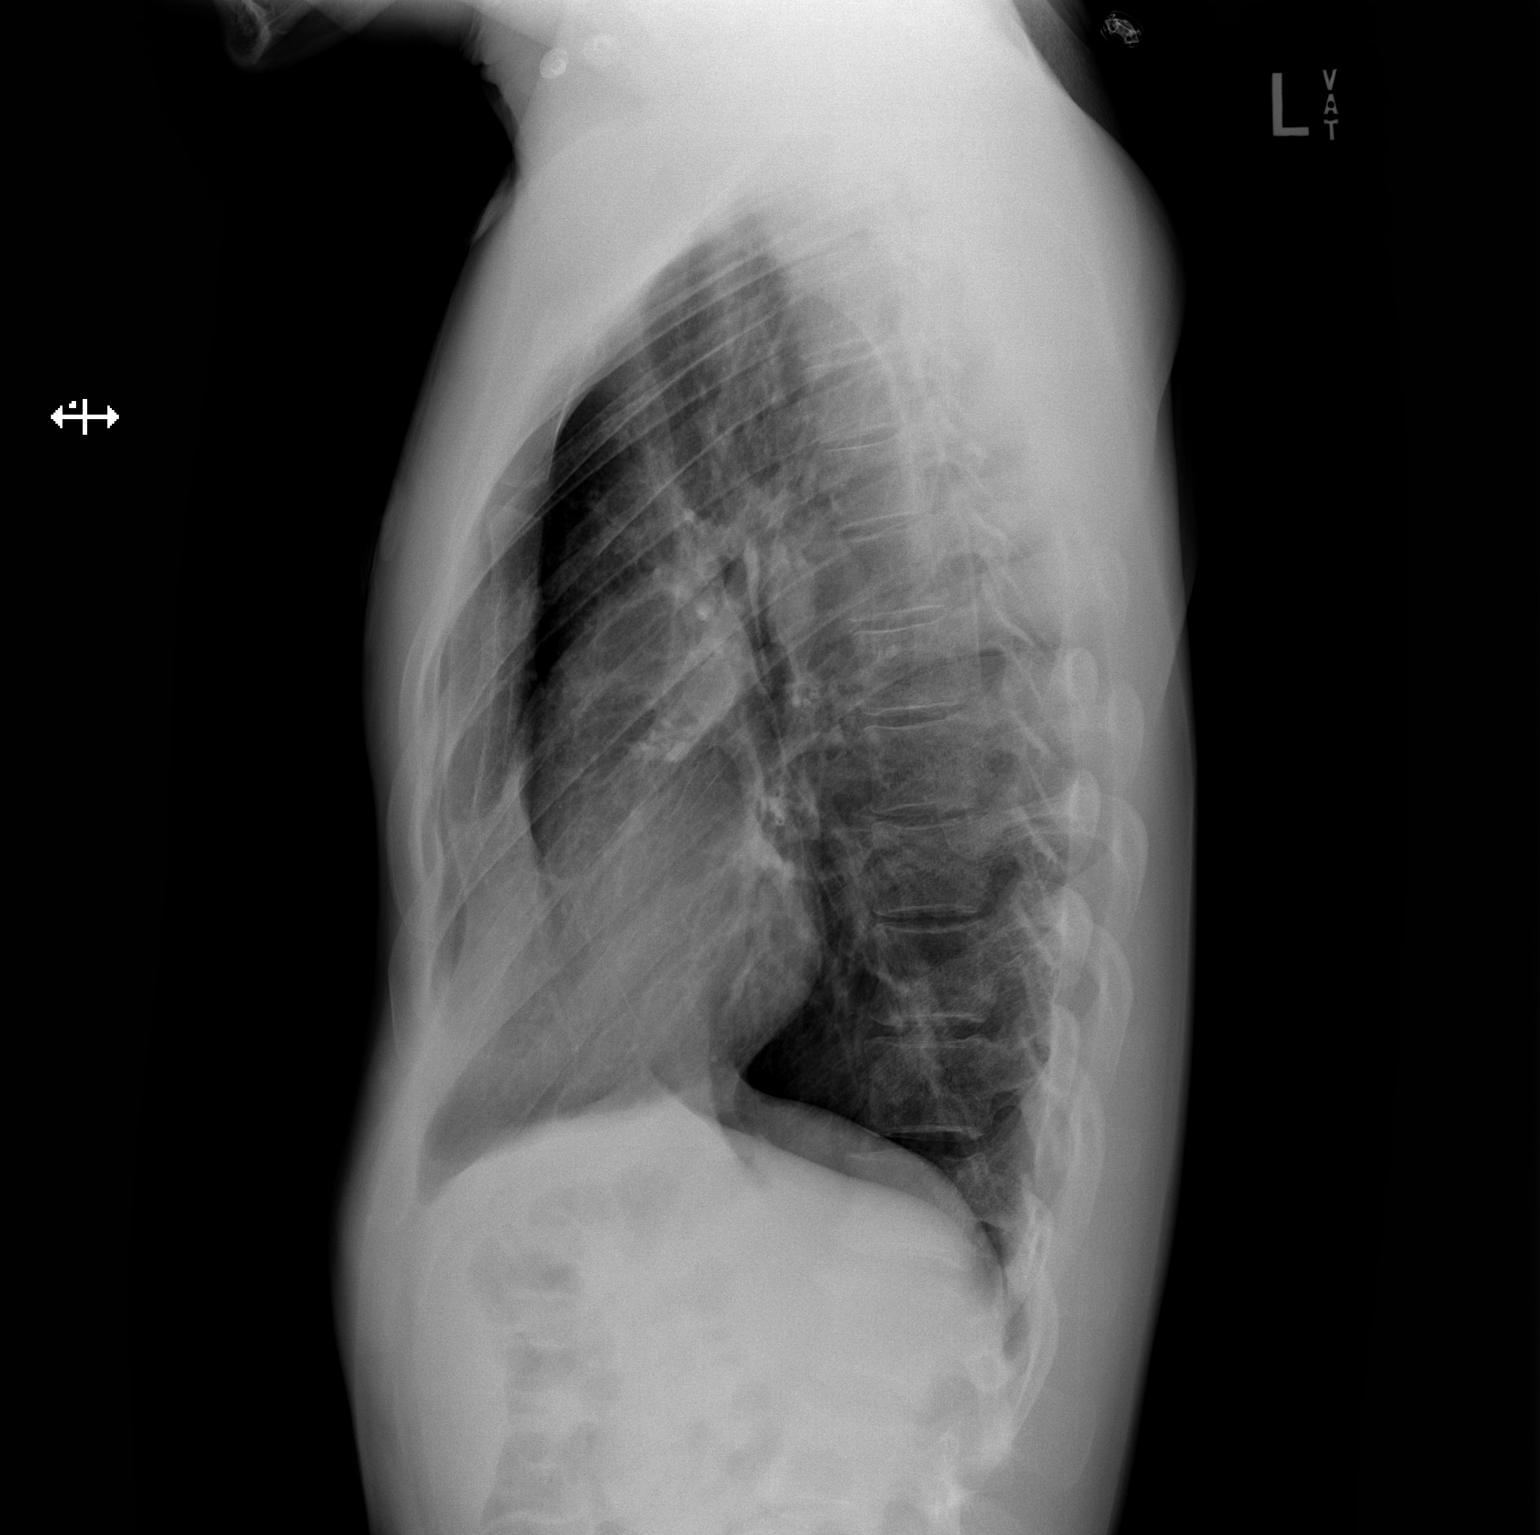

[2 of 2 positions shown; findings below may reference images not displayed]

FINDINGS: Pectus excavatum configuration of the anterior chest wall. Normal
heart size and mediastinal contours. Clear lungs. Slightly bulbous
deformity of the distal right clavicle since prior exam that may
reflect old remote trauma and healing.
IMPRESSION: No active cardiopulmonary disease.

Pectus excavatum.

## 2021-07-01 ENCOUNTER — Emergency Department (HOSPITAL_COMMUNITY)
Admission: EM | Admit: 2021-07-01 | Discharge: 2021-07-01 | Disposition: A | Discharge status: Home / Self Care | Payer: Self-pay | Attending: Emergency Medicine | Admitting: Emergency Medicine

## 2021-07-01 ENCOUNTER — Other Ambulatory Visit: Payer: Self-pay

## 2021-07-01 ENCOUNTER — Encounter (HOSPITAL_COMMUNITY): Payer: Self-pay | Admitting: Emergency Medicine

## 2021-07-01 DIAGNOSIS — R630 Anorexia: Secondary | ICD-10-CM | POA: Insufficient documentation

## 2021-07-01 DIAGNOSIS — R42 Dizziness and giddiness: Secondary | ICD-10-CM | POA: Insufficient documentation

## 2021-07-01 DIAGNOSIS — M542 Cervicalgia: Secondary | ICD-10-CM | POA: Insufficient documentation

## 2021-07-01 DIAGNOSIS — Z5321 Procedure and treatment not carried out due to patient leaving prior to being seen by health care provider: Secondary | ICD-10-CM | POA: Insufficient documentation

## 2021-07-01 DIAGNOSIS — R55 Syncope and collapse: Secondary | ICD-10-CM | POA: Insufficient documentation

## 2021-07-01 LAB — CBC
HCT: 40.9 % (ref 39.0–52.0)
Hemoglobin: 13.7 g/dL (ref 13.0–17.0)
MCH: 30.4 pg (ref 26.0–34.0)
MCHC: 33.5 g/dL (ref 30.0–36.0)
MCV: 90.7 fL (ref 80.0–100.0)
Platelets: 134 10*3/uL — ABNORMAL LOW (ref 150–400)
RBC: 4.51 MIL/uL (ref 4.22–5.81)
RDW: 12.2 % (ref 11.5–15.5)
WBC: 8.7 10*3/uL (ref 4.0–10.5)
nRBC: 0 % (ref 0.0–0.2)

## 2021-07-01 LAB — BASIC METABOLIC PANEL
Anion gap: 7 (ref 5–15)
BUN: 11 mg/dL (ref 6–20)
CO2: 24 mmol/L (ref 22–32)
Calcium: 8.6 mg/dL — ABNORMAL LOW (ref 8.9–10.3)
Chloride: 103 mmol/L (ref 98–111)
Creatinine, Ser: 1.17 mg/dL (ref 0.61–1.24)
GFR, Estimated: >60 mL/min (ref >60)
Glucose, Bld: 150 mg/dL — ABNORMAL HIGH (ref 70–99)
Potassium: 3.2 mmol/L — ABNORMAL LOW (ref 3.5–5.1)
Sodium: 134 mmol/L — ABNORMAL LOW (ref 135–145)

## 2021-07-01 LAB — CBG MONITORING, ED: Glucose-Capillary: 159 mg/dL — ABNORMAL HIGH (ref 70–99)

## 2021-07-01 MED ORDER — SODIUM CHLORIDE 0.9% FLUSH: 3.0000 mL | Freq: Once | INTRAVENOUS | Status: DC

## 2021-07-01 NOTE — ED Notes (Signed)
Called pt for room multiple times, no response. Not seen in lobby or triage

## 2021-07-01 NOTE — ED Notes (Signed)
Unable to give urine at this time

## 2021-07-01 NOTE — ED Triage Notes (Addendum)
Pt states he was leaving from visiting someone in hospital and while walking out he started having dizziness and neck pain and had a syncopal episode.  States he was told he hit his head.  Only reports neck "soreness" that started prior to syncopal event.  Reports decreased PO intake today.

## 2022-12-24 ENCOUNTER — Encounter (HOSPITAL_COMMUNITY): Payer: Self-pay | Admitting: Emergency Medicine

## 2022-12-24 ENCOUNTER — Ambulatory Visit (HOSPITAL_COMMUNITY)
Admission: EM | Admit: 2022-12-24 | Discharge: 2022-12-24 | Disposition: A | Discharge status: Home / Self Care | Payer: Self-pay | Attending: Internal Medicine | Admitting: Internal Medicine

## 2022-12-24 ENCOUNTER — Other Ambulatory Visit: Payer: Self-pay

## 2022-12-24 DIAGNOSIS — Z23 Encounter for immunization: Secondary | ICD-10-CM

## 2022-12-24 DIAGNOSIS — T23261A Burn of second degree of back of right hand, initial encounter: Secondary | ICD-10-CM

## 2022-12-24 MED ORDER — TETANUS-DIPHTH-ACELL PERTUSSIS 5-2.5-18.5 LF-MCG/0.5 IM SUSY
0.5000 mL | PREFILLED_SYRINGE | Freq: Once | INTRAMUSCULAR | Status: AC
  Administered 2022-12-24: 0.5 mL via INTRAMUSCULAR

## 2022-12-24 MED ORDER — TETANUS-DIPHTH-ACELL PERTUSSIS 5-2.5-18.5 LF-MCG/0.5 IM SUSY
PREFILLED_SYRINGE | INTRAMUSCULAR | Status: AC
  Filled 2022-12-24: qty 0.5

## 2022-12-24 MED ORDER — SILVER SULFADIAZINE 1 % EX CREA: 1.0000 | TOPICAL_CREAM | Freq: Every day | CUTANEOUS | 0 refills | Status: AC
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
